# Patient Record
Sex: Male | Born: 1985 | Race: White | Hispanic: No | Marital: Single | Smoking: Never smoker
Health system: Southern US, Community
[De-identification: ages and names within clinical notes are randomized; demographics above are authoritative.]

## PROBLEM LIST (undated history)

## (undated) HISTORY — PX: APPENDECTOMY: SHX54

---
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
## 2007-06-05 IMAGING — CT CT ABD-PELV W/ CM
1 of 2 series · 15 of 32 positions shown, 19 images · non-contrast
Comparison: none

REASON FOR EXAM: Fever and possible post op infection  s/p 04-02-06
appendectomy given gv
COMMENTS:

[Series 2: soft tissue · axial · 0.70mm/px · z∈[-1130,-690]mm · 15 of 61 slices shown, 19 images]
[im 3/61  soft-tissue]
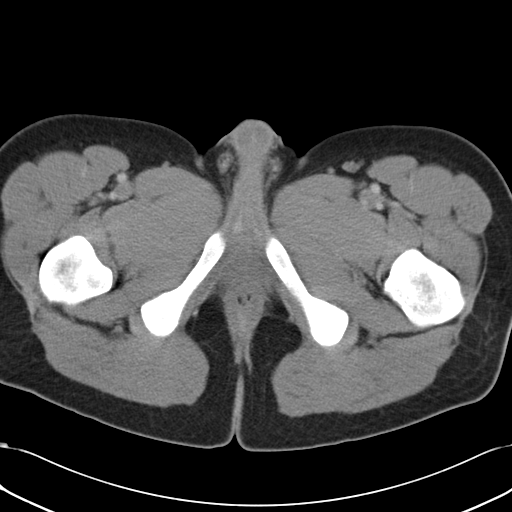
[im 3/61  bone]
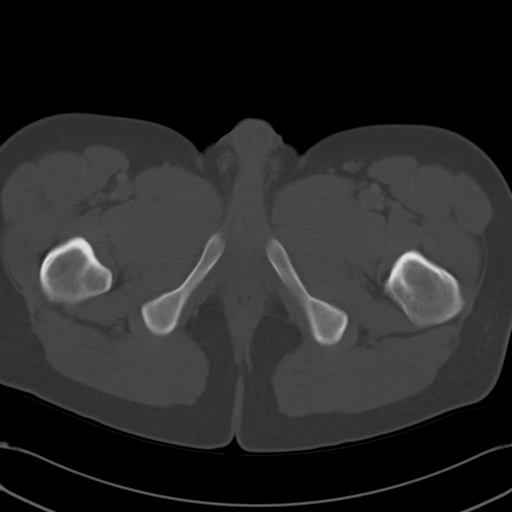
[im 9/61  soft-tissue]
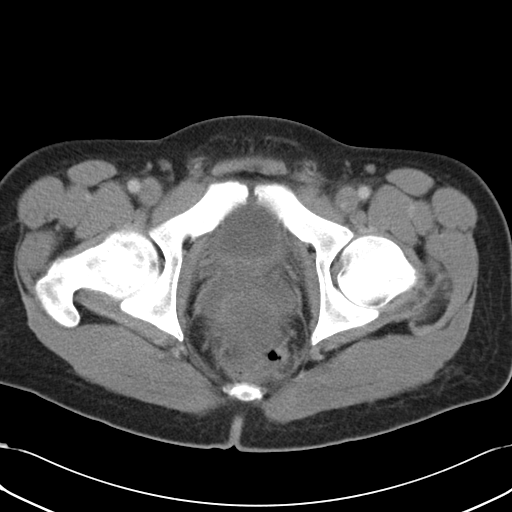
[im 14/61  soft-tissue]
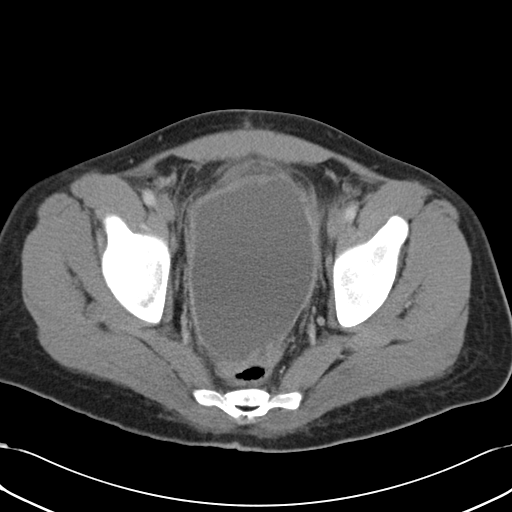
[im 17/61  soft-tissue]
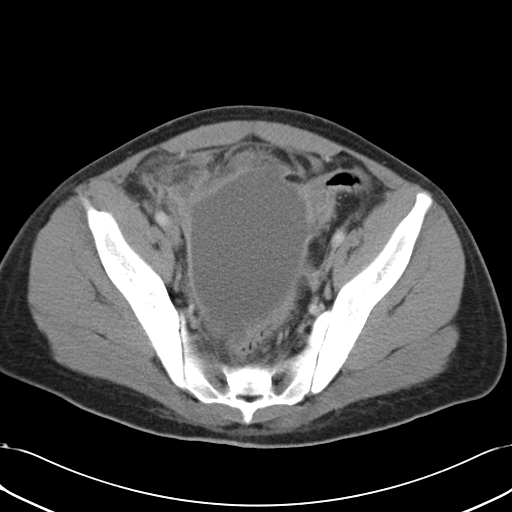
[im 22/61  soft-tissue]
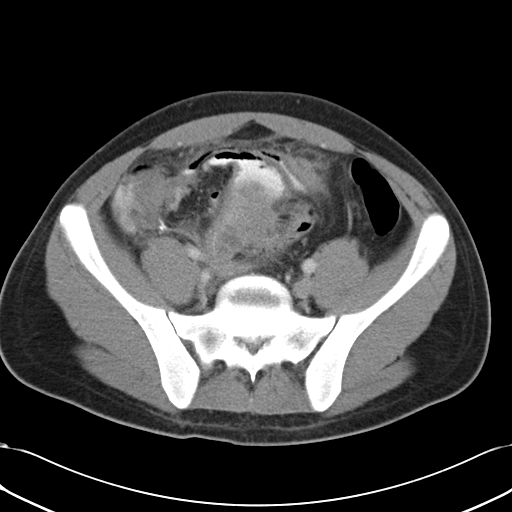
[im 25/61  soft-tissue]
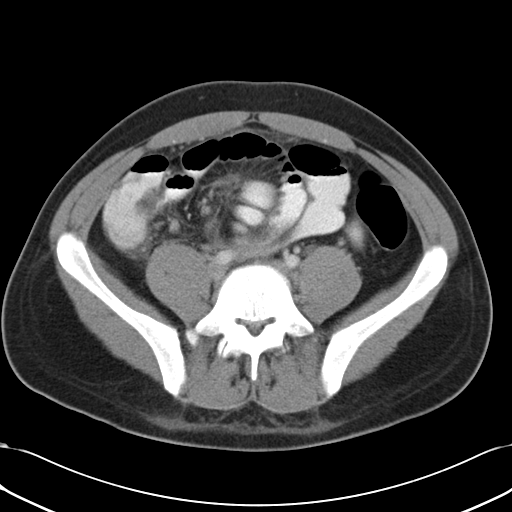
[im 30/61  soft-tissue]
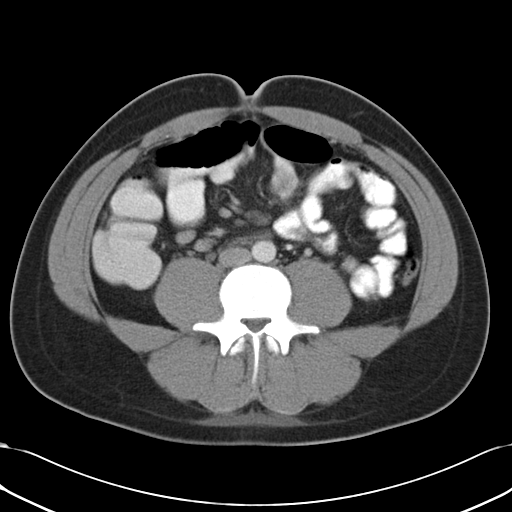
[im 36/61  soft-tissue]
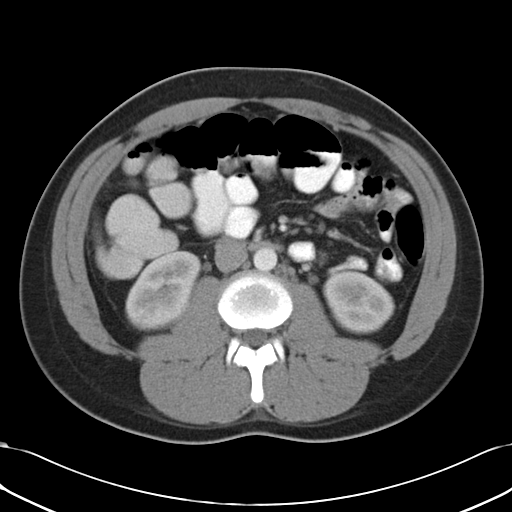
[im 39/61  soft-tissue]
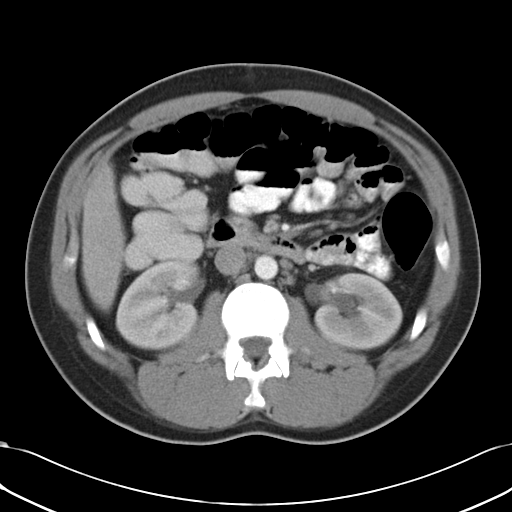
[im 39/61  bone]
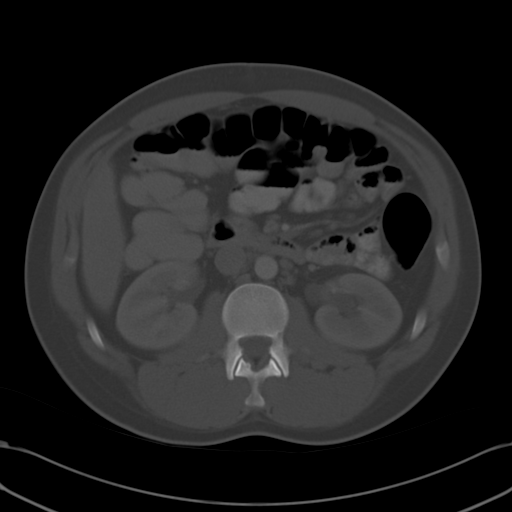
[im 44/61  soft-tissue]
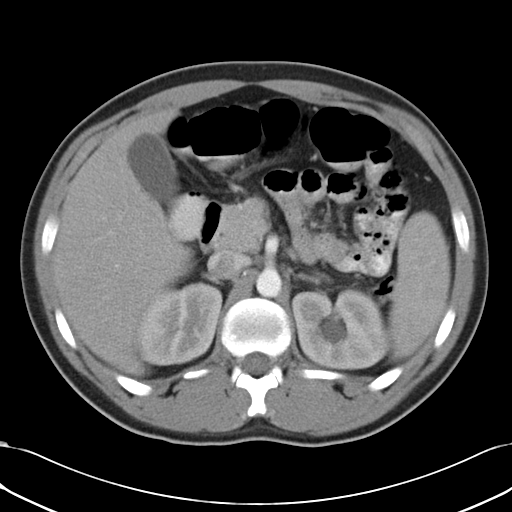
[im 47/61  soft-tissue]
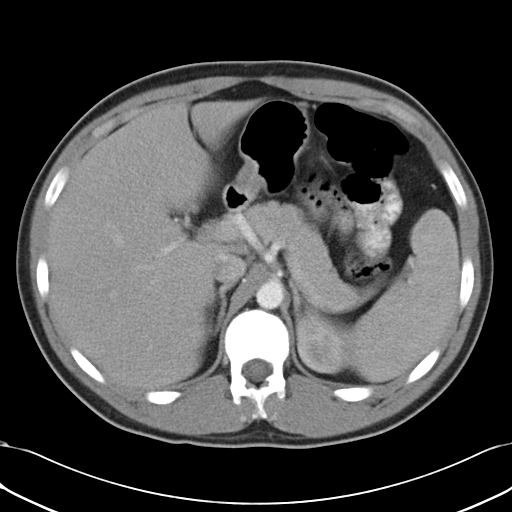
[im 50/61  lung]
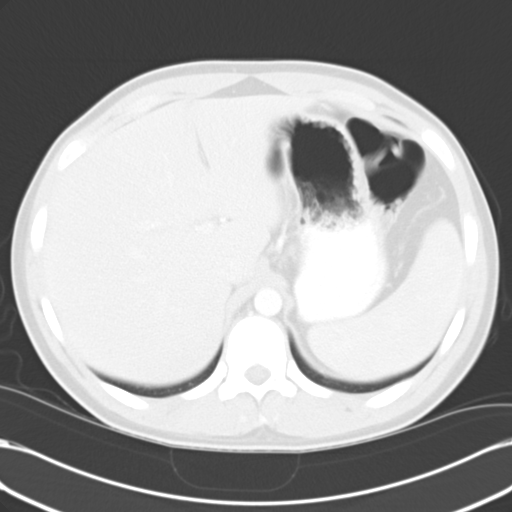
[im 52/61  soft-tissue]
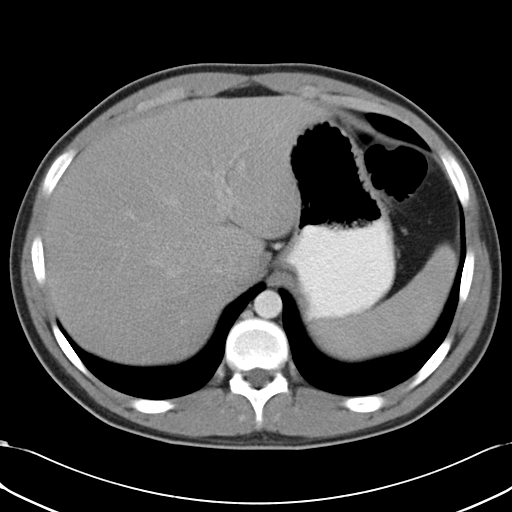
[im 52/61  lung]
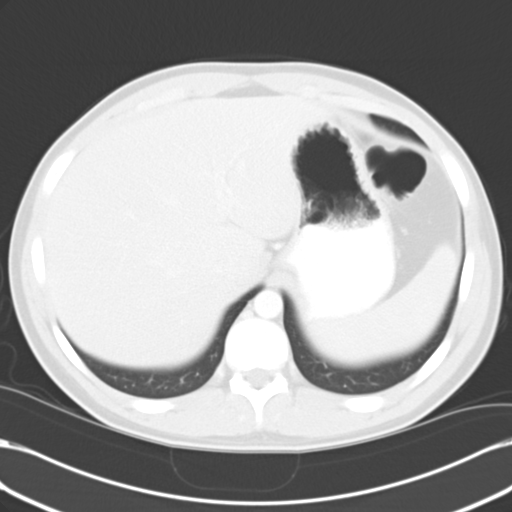
[im 55/61  lung]
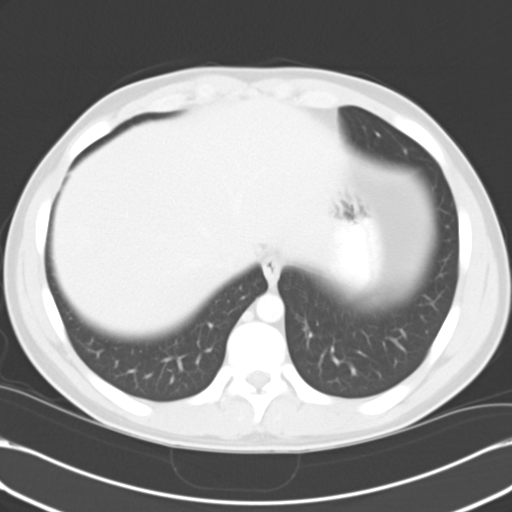
[im 58/61  soft-tissue]
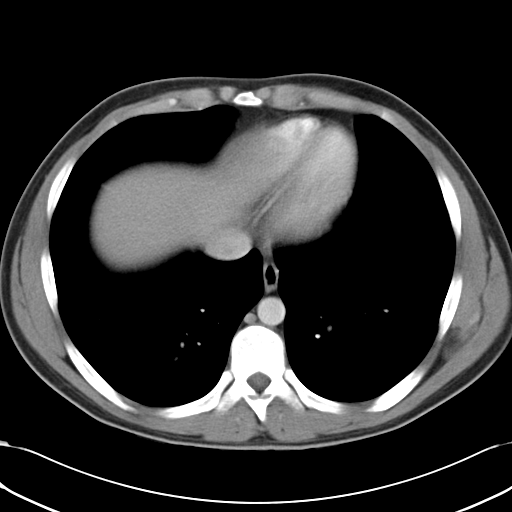
[im 58/61  lung]
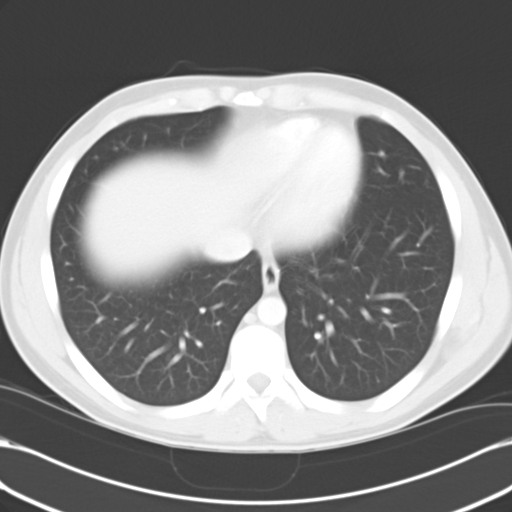

[15 of 32 positions shown; findings below may reference images not displayed]

PROCEDURE:     CT  - CT ABDOMEN / PELVIS  W  - April 16, 2006  [DATE]

RESULT:     Helical 8-mm sections were obtained from the lung bases through
the pubic symphysis status post intravenous administration of 100 ml of
Csovue-CEE. Evaluation of the lung bases demonstrates no gross
abnormalities. The liver, spleen, adrenals, pancreas, and RIGHT kidney are
unremarkable. Evaluation of the LEFT kidney demonstrates pelvocaliectasis as
well as mild dilatation of the proximal LEFT ureter. Evaluation of the upper
abdomen demonstrates no evidence of free fluid or drainable loculated fluid
collections.  The celiac, SMA, IMA, portal vein, and SMV are not clearly
identified.

Evaluation of the pelvis demonstrates a large loculated fluid collection
within the central pelvis extending from the pelvic inlet through the pelvic
base. This area measures approximately 11.88 x 8.56 cm in AP x transverse
dimensions. There is inflammatory change within the surrounding mesenteric
fat as well as a small amount of free fluid. This appears to rest on top of
the urinary bladder and the mild obstructive uropathy involving the LEFT
urinary collecting system is likely secondary to an element of compression.
IMPRESSION: 1.     Large loculated fluid collection within the pelvis as described
above.  This does appear to be in a position that will be amenable to
percutaneous drainage if clinically warranted.

## 2007-06-17 IMAGING — XA DG NEPHRO TUBE REMOVAL / FL
1 series · 2 of 2 positions shown · non-contrast
Comparison: none

REASON FOR EXAM: Removal of abscess tube
COMMENTS:

PROCEDURE:     VAS - TUBE REMOVAL / FLOURO  - April 28, 2006  [DATE]
RESULT:
HISTORY: RIGHT lower quadrant abscess.

[Series 1: run · 2 of 2 slices shown]
[im 1/2]
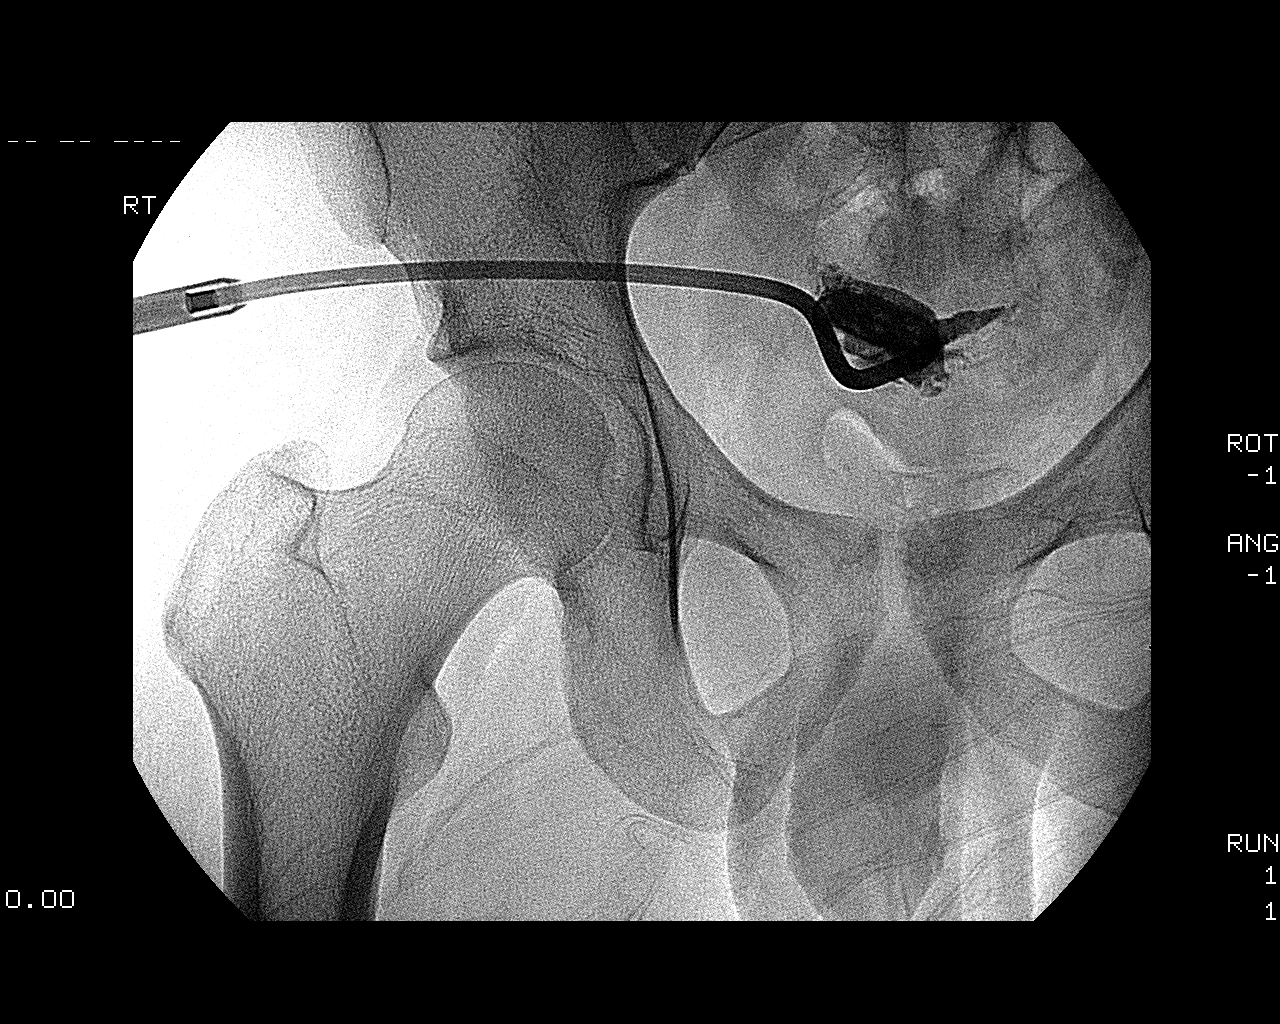
[im 2/2]
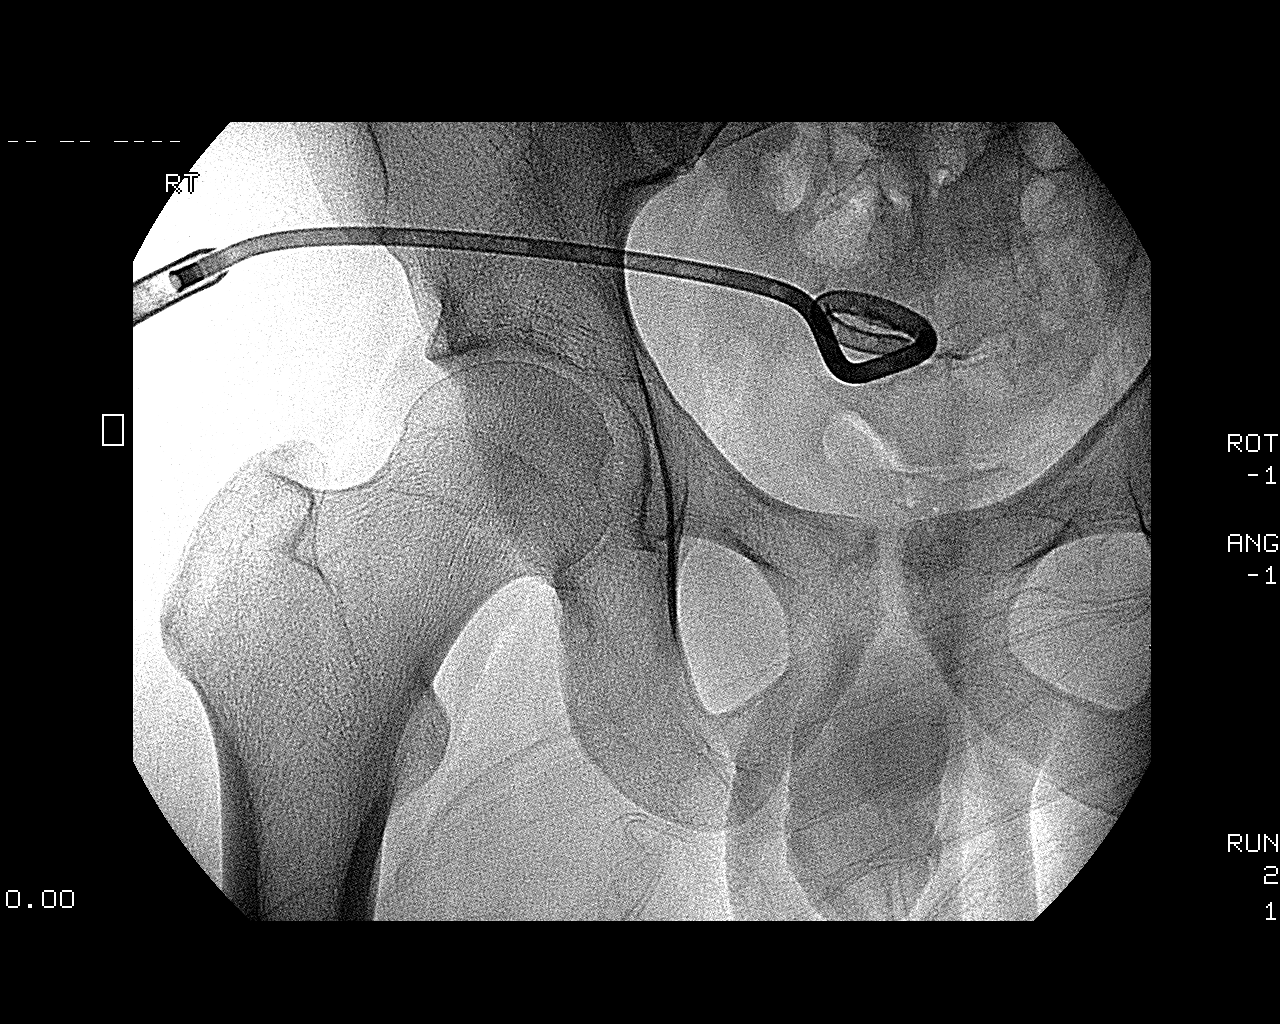

[2 of 2 positions shown; findings below may reference images not displayed]

PROCEDURE AND FINDINGS:  After discussing the risks and benefits of this
procedure with the patient informed consent was obtained.  The abdomen was
sterilely prepped and draped.  Nonionic contrast was administered into the
tube and revealed a much smaller abscess cavity.  The patient states that he
has had only an approximately 30-50 ml drainage over a 24-hour period.  No
significant drainage could be obtained with syringe aspiration.  Therefore
over an Amplatz extra stiff wire the catheter was removed.  There were no
complications.
IMPRESSION: Interim healing of abscess cavity with successful removal of abscess
drainage tube.  The patient was instructed to inform his physician if he
developed recurrent fever or abdominal pain.

## 2008-05-29 ENCOUNTER — Ambulatory Visit: Payer: Self-pay | Admitting: Internal Medicine

## 2008-09-26 ENCOUNTER — Emergency Department: Payer: Self-pay | Admitting: Unknown Physician Specialty

## 2013-03-15 ENCOUNTER — Emergency Department: Payer: Self-pay | Admitting: Emergency Medicine

## 2013-03-15 LAB — BASIC METABOLIC PANEL
ANION GAP: 4 — AB (ref 7–16)
BUN: 12 mg/dL (ref 7–18)
CALCIUM: 9.5 mg/dL (ref 8.5–10.1)
CHLORIDE: 102 mmol/L (ref 98–107)
Co2: 30 mmol/L (ref 21–32)
Creatinine: 0.88 mg/dL (ref 0.60–1.30)
EGFR (African American): >60
GLUCOSE: 101 mg/dL — AB (ref 65–99)
Osmolality: 272 (ref 275–301)
POTASSIUM: 3.7 mmol/L (ref 3.5–5.1)
SODIUM: 136 mmol/L (ref 136–145)

## 2013-03-15 LAB — CBC
HCT: 42.1 % (ref 40.0–52.0)
HGB: 14.6 g/dL (ref 13.0–18.0)
MCH: 31.1 pg (ref 26.0–34.0)
MCHC: 34.6 g/dL (ref 32.0–36.0)
MCV: 90 fL (ref 80–100)
Platelet: 182 10*3/uL (ref 150–440)
RBC: 4.68 10*6/uL (ref 4.40–5.90)
RDW: 12.7 % (ref 11.5–14.5)
WBC: 6.1 10*3/uL (ref 3.8–10.6)

## 2013-03-16 LAB — TSH

## 2013-03-16 LAB — MAGNESIUM: Magnesium: 2 mg/dL

## 2013-03-16 LAB — TROPONIN I

## 2017-11-14 ENCOUNTER — Ambulatory Visit
Admission: EM | Admit: 2017-11-14 | Discharge: 2017-11-14 | Disposition: A | Discharge status: Home / Self Care | Payer: BLUE CROSS/BLUE SHIELD | Attending: Family Medicine | Admitting: Family Medicine

## 2017-11-14 DIAGNOSIS — M791 Myalgia, unspecified site: Secondary | ICD-10-CM

## 2017-11-14 DIAGNOSIS — J111 Influenza due to unidentified influenza virus with other respiratory manifestations: Secondary | ICD-10-CM

## 2017-11-14 DIAGNOSIS — R509 Fever, unspecified: Secondary | ICD-10-CM

## 2017-11-14 DIAGNOSIS — J029 Acute pharyngitis, unspecified: Secondary | ICD-10-CM

## 2017-11-14 DIAGNOSIS — R69 Illness, unspecified: Principal | ICD-10-CM

## 2017-11-14 LAB — RAPID STREP SCREEN (MED CTR MEBANE ONLY): STREPTOCOCCUS, GROUP A SCREEN (DIRECT): NEGATIVE

## 2017-11-14 LAB — RAPID INFLUENZA A&B ANTIGENS
Influenza A (ARMC): NEGATIVE
Influenza B (ARMC): NEGATIVE

## 2017-11-14 MED ORDER — OSELTAMIVIR PHOSPHATE 75 MG PO CAPS: 75.0000 mg | ORAL_CAPSULE | Freq: Two times a day (BID) | ORAL | 0 refills | Status: DC

## 2017-11-14 NOTE — Discharge Instructions (Signed)
Viral illness vs flu.  Tamiflu as prescribed.  Ibuprofen as needed.  Take care  Dr. Adriana Simas

## 2017-11-14 NOTE — ED Triage Notes (Signed)
Pt here for fevers that started last night and has been getting worse. States when he took it last night it was 102, did take 2 tylenol at 5am and his temp is now down. Also having body aches and chills. Dry cough and just feeling bad.

## 2017-11-14 NOTE — ED Provider Notes (Signed)
[facility]    CSN: [id number] Arrival date & time: 11/14/17  0802  History   Chief Complaint Chief Complaint  Patient presents with  . Fever   HPI  32 year old male presents with fever.  Patient reports mild sore throat yesterday.  Subsequently developed fever last night, T-max 102 tympanic.  Patient reports associated body aches and chills.  He is taken ibuprofen with improvement in his fever.  His son has been sick with a low-grade fever.  His wife is not sick at home.  No other medications or interventions tried.  Symptoms are moderate in severity currently.  No other reported symptoms.  No other complaints.   History reviewed and updated as below.  PMH: Hyperthyroidism, Palpitations  Surgical Hx:  Past Surgical History:  Procedure Laterality Date  . APPENDECTOMY      Social History   Tobacco Use  . Smoking status: Current Every Day Smoker  . Smokeless tobacco: Current User  Substance Use Topics  . Alcohol use: Yes  . Drug use: Never    Home Medications    Prior to Admission medications   Medication Sig Start Date End Date Taking? Authorizing Provider  oseltamivir (TAMIFLU) 75 MG capsule Take 1 capsule (75 mg total) by mouth every 12 (twelve) hours. 11/14/17   Tommie Sams, DO   Social History Social History   Tobacco Use  . Smoking status: Current Every Day Smoker  . Smokeless tobacco: Current User  Substance Use Topics  . Alcohol use: Yes  . Drug use: Never   Allergies   Patient has no known allergies.  Review of Systems Review of Systems  Constitutional: Positive for chills and fever.  HENT: Positive for sore throat.   Musculoskeletal:       Bodyaches.   Physical Exam Triage Vital Signs ED Triage Vitals  Enc Vitals Group     BP      Pulse      Resp      Temp      Temp src      SpO2      Weight      Height      Head Circumference      Peak Flow      Pain Score      Pain Loc      Pain Edu?      Excl. in GC?     Updated Vital Signs BP 109/72 (BP Location: Left Arm)   Pulse (!) 110   Temp 99.3 F (37.4 C) (Oral)   Resp 18   Wt 88.9 kg   SpO2 98%   Visual Acuity Right Eye Distance:   Left Eye Distance:   Bilateral Distance:    Right Eye Near:   Left Eye Near:    Bilateral Near:     Physical Exam  Constitutional: He is oriented to person, place, and time. He appears well-developed. No distress.  HENT:  Head: Atraumatic.  Mouth/Throat: Oropharynx is clear and moist.  Eyes: Conjunctivae are normal. Right eye exhibits no discharge. Left eye exhibits no discharge.  Cardiovascular:  Regular rhythm. Tachycardic.  Pulmonary/Chest: Effort normal. He has no wheezes. He has no rales.  Neurological: He is alert and oriented to person, place, and time.  Psychiatric: He has a normal mood and affect. His behavior is normal.  Nursing note and vitals reviewed.  UC Treatments / Results  Labs (all labs ordered are listed, but only abnormal results are displayed) Labs Reviewed  RAPID  STREP SCREEN ([facility] ONLY)  RAPID INFLUENZA A&B ANTIGENS ([facility] ONLY)  CULTURE, GROUP A STREP [facility])    EKG None  Radiology No results found.  Procedures Procedures (including critical care time)  Medications Ordered in UC Medications - No data to display  Initial Impression / Assessment and Plan / UC Course  I have reviewed the triage vital signs and the nursing notes.  Pertinent labs & imaging results that were available during my care of the patient were reviewed by me and considered in my medical decision making (see chart for details).    32 year old male presents with an influenza-like illness.  His flu testing was negative here.  Strep negative.  There is still an underlying concern for flu as our flu test is not stellar test.  Tamiflu as prescribed.  Ibuprofen as needed.  Work note given.  Supportive care.  Final Clinical Impressions(s) / UC Diagnoses   Final diagnoses:   Influenza-like illness     Discharge Instructions     Viral illness vs flu.  Tamiflu as prescribed.  Ibuprofen as needed.  Take care  Dr. Adriana Simas     ED Prescriptions    Medication Sig Dispense Auth. Provider   oseltamivir (TAMIFLU) 75 MG capsule Take 1 capsule (75 mg total) by mouth every 12 (twelve) hours. 10 capsule Tommie Sams, DO     Controlled Substance Prescriptions [place] Controlled Substance Registry consulted? Not Applicable   Tommie Sams, [place] 11/14/17 1610

## 2017-11-17 LAB — CULTURE, GROUP A STREP (THRC)

## 2018-09-20 ENCOUNTER — Other Ambulatory Visit: Payer: Self-pay

## 2018-09-20 DIAGNOSIS — Z20822 Contact with and (suspected) exposure to covid-19: Secondary | ICD-10-CM

## 2018-09-21 LAB — NOVEL CORONAVIRUS, NAA: SARS-CoV-2, NAA: NOT DETECTED

## 2021-07-14 ENCOUNTER — Encounter: Payer: Self-pay | Admitting: Emergency Medicine

## 2021-07-14 ENCOUNTER — Other Ambulatory Visit: Payer: Self-pay

## 2021-07-14 ENCOUNTER — Ambulatory Visit
Admission: EM | Admit: 2021-07-14 | Discharge: 2021-07-14 | Disposition: A | Discharge status: Home / Self Care | Payer: 59 | Attending: Family Medicine | Admitting: Family Medicine

## 2021-07-14 DIAGNOSIS — J0100 Acute maxillary sinusitis, unspecified: Secondary | ICD-10-CM | POA: Diagnosis not present

## 2021-07-14 DIAGNOSIS — H1033 Unspecified acute conjunctivitis, bilateral: Secondary | ICD-10-CM

## 2021-07-14 MED ORDER — AMOXICILLIN-POT CLAVULANATE 875-125 MG PO TABS: 1.0000 | ORAL_TABLET | Freq: Two times a day (BID) | ORAL | 0 refills | Status: AC

## 2021-07-14 MED ORDER — MOXIFLOXACIN HCL 0.5 % OP SOLN: 1.0000 [drp] | Freq: Three times a day (TID) | OPHTHALMIC | 0 refills | Status: AC

## 2021-07-14 NOTE — ED Provider Notes (Signed)
[facility]    CSN: [id number] Arrival date & time: 07/14/21  2774   History   Chief Complaint Chief Complaint  Patient presents with   Sinus Problem   Nasal Congestion    Sinus Problem    36 year old male presents for evaluation of the above.  Patient reports greater than 1 week history of symptoms.  He reports sinus pressure, congestion, postnasal drip.  Denies fever.  He also reports associated dental discomfort.  He states that he woke up this morning and had bilateral eye discharge and matting.  No known sick contacts.  His discomfort is currently 5/10 in severity.  No relieving factors.  No other associated symptoms.  No other complaints.  Past Surgical History:  Procedure Laterality Date   APPENDECTOMY      Home Medications    Prior to Admission medications   Medication Sig Start Date End Date Taking? Authorizing Provider  amoxicillin-clavulanate (AUGMENTIN) 875-125 MG tablet Take 1 tablet by mouth every 12 (twelve) hours. 07/14/21  Yes Audrena Talaga G, DO  moxifloxacin (VIGAMOX) 0.5 % ophthalmic solution Place 1 drop into both eyes 3 (three) times daily for 7 days. 07/14/21 07/21/21 Yes Tommie Sams, DO    Social History Social History   Tobacco Use   Smoking status: Never   Smokeless tobacco: Never  Vaping Use   Vaping Use: Never used  Substance Use Topics   Alcohol use: Yes   Drug use: Never     Allergies   Patient has no known allergies.   Review of Systems Review of Systems Per HPI  Physical Exam Triage Vital Signs ED Triage Vitals  Enc Vitals Group     BP 07/14/21 0824 128/82     Pulse Rate 07/14/21 0824 95     Resp 07/14/21 0824 15     Temp 07/14/21 0824 98.8 F (37.1 C)     Temp Source 07/14/21 0824 Oral     SpO2 07/14/21 0824 97 %     Weight 07/14/21 0821 205 lb (93 kg)     Height 07/14/21 0821 5\' 11"  (1.803 m)     Head Circumference --      Peak Flow --      Pain Score 07/14/21 0821 5     Pain Loc --      Pain Edu? --       Excl. in GC? --    Updated Vital Signs BP 128/82 (BP Location: Right Arm)   Pulse 95   Temp 98.8 F (37.1 C) (Oral)   Resp 15   Ht 5\' 11"  (1.803 m)   Wt 93 kg   SpO2 97%   BMI 28.59 kg/m   Visual Acuity Right Eye Distance:   Left Eye Distance:   Bilateral Distance:    Right Eye Near:   Left Eye Near:    Bilateral Near:     Physical Exam Vitals and nursing note reviewed.  Constitutional:      General: He is not in acute distress.    Appearance: He is not ill-appearing.  HENT:     Head: Normocephalic and atraumatic.     Right Ear: Tympanic membrane normal.     Left Ear: Tympanic membrane normal.     Nose: Congestion present.     Mouth/Throat:     Pharynx: Posterior oropharyngeal erythema present.  Eyes:     Comments: Bilateral conjunctival injection.  Cardiovascular:     Rate and Rhythm: Normal rate and regular rhythm.  Pulmonary:     Effort: Pulmonary effort is normal.     Breath sounds: Normal breath sounds. No wheezing, rhonchi or rales.  Neurological:     Mental Status: He is alert.    UC Treatments / Results  Labs (all labs ordered are listed, but only abnormal results are displayed) Labs Reviewed - No data to display  EKG   Radiology No results found.  Procedures Procedures (including critical care time)  Medications Ordered in UC Medications - No data to display  Initial Impression / Assessment and Plan / UC Course  I have reviewed the triage vital signs and the nursing notes.  Pertinent labs & imaging results that were available during my care of the patient were reviewed by me and considered in my medical decision making (see chart for details).    36 year old male presents with sinusitis and conjunctivitis.  Treating with Augmentin and Vigamox.  Final Clinical Impressions(s) / UC Diagnoses   Final diagnoses:  Acute maxillary sinusitis, recurrence not specified  Acute bacterial conjunctivitis of both eyes     Discharge  Instructions      Medication as prescribed.  Lots of rest and fluids.  Take care  Dr. Adriana Simas      ED Prescriptions     Medication Sig Dispense Auth. Provider   amoxicillin-clavulanate (AUGMENTIN) 875-125 MG tablet Take 1 tablet by mouth every 12 (twelve) hours. 14 tablet Pate Aylward G, DO   moxifloxacin (VIGAMOX) 0.5 % ophthalmic solution Place 1 drop into both eyes 3 (three) times daily for 7 days. 3 mL Tommie Sams, DO      PDMP not reviewed this encounter.   Everlene Other [place], [place] 07/14/21 [phone]

## 2021-07-14 NOTE — Discharge Instructions (Signed)
Medication as prescribed.  Lots of rest and fluids.  Take care  Dr. Adriana Simas

## 2021-07-14 NOTE — ED Triage Notes (Signed)
Patient c/o sinus congestion, post nasal drip, runny nose that started a week ago.  Patient states that since Thursday his congestion has gotten worse and has started having sinus pain and pressure and teeth pain.  Patient states that he woke up this morning with his eyes matted and thick drainage.  Patient denies fevers.

## 2023-05-04 ENCOUNTER — Ambulatory Visit: Payer: 59 | Admitting: Dermatology
